# Patient Record
Sex: Male | Born: 1970 | Race: Black or African American | Hispanic: No | Marital: Married | Smoking: Never smoker
Health system: Southern US, Community
[De-identification: ages and names within clinical notes are randomized; demographics above are authoritative.]

## PROBLEM LIST (undated history)

## (undated) DIAGNOSIS — C8330 Diffuse large B-cell lymphoma, unspecified site: Secondary | ICD-10-CM

## (undated) DIAGNOSIS — D804 Selective deficiency of immunoglobulin M [IgM]: Secondary | ICD-10-CM

## (undated) DIAGNOSIS — G8929 Other chronic pain: Secondary | ICD-10-CM

## (undated) DIAGNOSIS — R9431 Abnormal electrocardiogram [ECG] [EKG]: Secondary | ICD-10-CM

## (undated) DIAGNOSIS — M51369 Other intervertebral disc degeneration, lumbar region without mention of lumbar back pain or lower extremity pain: Secondary | ICD-10-CM

## (undated) DIAGNOSIS — I10 Essential (primary) hypertension: Secondary | ICD-10-CM

## (undated) DIAGNOSIS — M5441 Lumbago with sciatica, right side: Secondary | ICD-10-CM

## (undated) DIAGNOSIS — M5136 Other intervertebral disc degeneration, lumbar region: Secondary | ICD-10-CM

## (undated) DIAGNOSIS — E119 Type 2 diabetes mellitus without complications: Secondary | ICD-10-CM

## (undated) DIAGNOSIS — E785 Hyperlipidemia, unspecified: Secondary | ICD-10-CM

## (undated) DIAGNOSIS — G4733 Obstructive sleep apnea (adult) (pediatric): Secondary | ICD-10-CM

## (undated) HISTORY — DX: Selective deficiency of immunoglobulin m (igm): D80.4

## (undated) HISTORY — DX: Abnormal electrocardiogram (ECG) (EKG): R94.31

## (undated) HISTORY — DX: Other intervertebral disc degeneration, lumbar region: M51.36

## (undated) HISTORY — DX: Lumbago with sciatica, right side: M54.41

## (undated) HISTORY — DX: Hyperlipidemia, unspecified: E78.5

## (undated) HISTORY — DX: Other intervertebral disc degeneration, lumbar region without mention of lumbar back pain or lower extremity pain: M51.369

## (undated) HISTORY — DX: Essential (primary) hypertension: I10

## (undated) HISTORY — DX: Other chronic pain: G89.29

## (undated) HISTORY — DX: Diffuse large B-cell lymphoma, unspecified site: C83.30

## (undated) HISTORY — DX: Type 2 diabetes mellitus without complications: E11.9

## (undated) HISTORY — DX: Obstructive sleep apnea (adult) (pediatric): G47.33

---

## 2003-11-30 ENCOUNTER — Ambulatory Visit (HOSPITAL_BASED_OUTPATIENT_CLINIC_OR_DEPARTMENT_OTHER): Admission: RE | Admit: 2003-11-30 | Discharge: 2003-11-30 | Payer: Self-pay | Admitting: Otolaryngology

## 2010-09-16 ENCOUNTER — Ambulatory Visit
Admission: RE | Admit: 2010-09-16 | Discharge: 2010-09-16 | Disposition: A | Discharge status: Home / Self Care | Payer: No Typology Code available for payment source | Source: Ambulatory Visit | Attending: Family Medicine | Admitting: Family Medicine

## 2010-09-16 ENCOUNTER — Other Ambulatory Visit: Payer: Self-pay | Admitting: Family Medicine

## 2010-09-16 DIAGNOSIS — M25519 Pain in unspecified shoulder: Secondary | ICD-10-CM

## 2010-09-16 DIAGNOSIS — Z5689 Other problems related to employment: Secondary | ICD-10-CM

## 2013-02-23 ENCOUNTER — Ambulatory Visit (INDEPENDENT_AMBULATORY_CARE_PROVIDER_SITE_OTHER): Payer: Self-pay

## 2013-02-23 ENCOUNTER — Other Ambulatory Visit: Payer: Self-pay | Admitting: Family Medicine

## 2013-02-23 DIAGNOSIS — R52 Pain, unspecified: Secondary | ICD-10-CM

## 2013-02-23 DIAGNOSIS — M549 Dorsalgia, unspecified: Secondary | ICD-10-CM

## 2013-02-23 DIAGNOSIS — R079 Chest pain, unspecified: Secondary | ICD-10-CM

## 2013-11-04 ENCOUNTER — Other Ambulatory Visit: Payer: Self-pay | Admitting: Family Medicine

## 2013-11-04 ENCOUNTER — Ambulatory Visit (INDEPENDENT_AMBULATORY_CARE_PROVIDER_SITE_OTHER): Payer: Self-pay

## 2013-11-04 DIAGNOSIS — M79609 Pain in unspecified limb: Secondary | ICD-10-CM
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
## 2017-06-23 ENCOUNTER — Encounter: Payer: Self-pay | Admitting: Neurology

## 2017-06-23 ENCOUNTER — Ambulatory Visit: Payer: PRIVATE HEALTH INSURANCE | Admitting: Neurology

## 2017-06-23 VITALS — BP 131/89 | HR 66 | Ht 70.0 in | Wt 312.0 lb

## 2017-06-23 DIAGNOSIS — Z8572 Personal history of non-Hodgkin lymphomas: Secondary | ICD-10-CM | POA: Diagnosis not present

## 2017-06-23 DIAGNOSIS — R400 Somnolence: Secondary | ICD-10-CM

## 2017-06-23 DIAGNOSIS — G4733 Obstructive sleep apnea (adult) (pediatric): Secondary | ICD-10-CM

## 2017-06-23 DIAGNOSIS — R002 Palpitations: Secondary | ICD-10-CM | POA: Diagnosis not present

## 2017-06-23 DIAGNOSIS — R0789 Other chest pain: Secondary | ICD-10-CM | POA: Diagnosis not present

## 2017-06-23 DIAGNOSIS — G2581 Restless legs syndrome: Secondary | ICD-10-CM

## 2017-06-23 DIAGNOSIS — Z6841 Body Mass Index (BMI) 40.0 and over, adult: Secondary | ICD-10-CM | POA: Diagnosis not present

## 2017-06-23 DIAGNOSIS — R351 Nocturia: Secondary | ICD-10-CM

## 2017-06-23 NOTE — Progress Notes (Signed)
Subjective:    Patient ID: Billy Melendez is a 46 y.o. male.  HPI     Star Age, MD, PhD [facility] [address], [address] [place], [place] [address]  Dear Lorre Nick,   I saw your patient, Billy Melendez, upon your kind request, in my neurologic clinic today for initial consultation of his sleep disorder, in particular, concern for underlying obstructive sleep apnea. The patient is unaccompanied today. As you know, Billy Melendez is a 46 year old right-handed gentleman with an underlying medical history of hypertension, non-Hodgkin's lymphoma, hyperlipidemia, type 2 diabetes, chronic low back pain and morbid obesity with a BMI of over 40, who reports snoring and excessive daytime somnolence. He had a sleep study several years ago under ENT. Prior sleep study results are not available for my review today, but he was told he had OSA. I reviewed your office note from 04/22/2017, which you kindly included. His Epworth sleepiness score is 22 out of 24 today, fatigue score of 63 out of 63. He is married and lives with his wife. They have 4 children. He is a nonsmoker and does not utilize alcohol, does not drink caffeine on a daily basis. He had lost weight in the past, down to 260 lb, but the back pain came back and he was not able to exercise. He had lymph node removal in 12/16, s/p chemo and radiation in 1/17. BT varies and is after his wife's. 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 He has an appointment with pain management pending. He had a recent episode of chest pain and history of palpitations. He has seen cardiology for this. He was supposed to have an echocardiogram but has not had it yet. He endorses restless leg symptoms at night. He has leg movements during sleep. He typically does not have morning headaches. He has multiple bathroom breaks at night. He does not typically keep a scheduled for his bedtime and wake time. His snoring is loud and bothersome to his  wife.  His Past Medical History Is Significant For: Past Medical History:  Diagnosis Date  . Abnormal EKG   . Chronic bilateral low back pain with right-sided sciatica   . Degenerative disc disease, lumbar   . Diabetes mellitus without complication ([place])   . DLBCL (diffuse large B cell lymphoma) ([place])   . Hyperlipidemia LDL goal <100   . Hypertension   . 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 OSA (obstructive sleep apnea)     His Past Surgical History Is Significant For:  His Family History Is Significant For: No family history on file.  His Social History Is Significant For: Social History   Socioeconomic History  . Marital status: Married    Spouse name: None  . Number of children: None  . Years of education: None  . Highest education level: None  Social Needs  . Financial resource strain: None  . Food insecurity - worry: None  . Food insecurity - inability: None  . Transportation needs - medical: None  . Transportation needs - non-medical: None  Occupational History  . None  Tobacco Use  . Smoking status: Never Smoker  . Smokeless tobacco: Never Used  Substance and Sexual Activity  . Alcohol use: No    Frequency: Never  . Drug use: No  . Sexual activity: None  Other Topics Concern  . None  Social History Narrative  . None    His Allergies Are:  Allergies  Allergen Reactions  .  Peanut-Containing Drug Products   . Pineapple Itching  . Tramadol   :   His Current Medications Are:  Outpatient Encounter Medications as of 06/23/2017  Medication Sig  . atenolol (TENORMIN) 100 MG tablet Take 100 mg by mouth daily.  Marland Kitchen atorvastatin (LIPITOR) 20 MG tablet Take 20 mg by mouth daily.  . Ginger, Zingiber officinalis, (GINGER ROOT PO) Take by mouth.  . metFORMIN (GLUCOPHAGE) 500 MG tablet Take by mouth 2 (two) times daily with a meal.  . nabumetone (RELAFEN) 750 MG tablet Take 750 mg by mouth daily.  . sildenafil (REVATIO) 20 MG tablet Take 20 mg by mouth.   No  facility-administered encounter medications on file as of 06/23/2017.   :  Review of Systems:  Out of a complete 14 point review of systems, all are reviewed and negative with the exception of these symptoms as listed below: Review of Systems  Neurological:       Pt presents today to discuss his sleep. Pt has had a sleep study in the past but was not treated for osa. Pt does endorse snoring.  Epworth Sleepiness Scale 0= would never doze 1= slight chance of dozing 2= moderate chance of dozing 3= high chance of dozing  Sitting and reading: 3 Watching TV: 3 Sitting inactive in a public place (ex. Theater or meeting): 3 As a passenger in a car for an hour without a break: 3 Lying down to rest in the afternoon: 3 Sitting and talking to someone: 2 Sitting quietly after lunch (no alcohol): 3 In a car, while stopped in traffic: 2 Total: 22    Objective:  Neurological Exam  Physical Exam Physical Examination:   Vitals:   06/23/17 1535  BP: 131/89  Pulse: 66    General Examination: The patient is a very pleasant 46 y.o. male in no acute distress. He appears well-developed and well-nourished and well groomed.   HEENT: Normocephalic, atraumatic, pupils are equal, round and reactive to light and accommodation. Funduscopic exam is normal with sharp disc margins noted. Extraocular tracking is good without limitation to gaze excursion or nystagmus noted. Normal smooth pursuit is noted. Hearing is grossly intact. Tympanic membranes are clear bilaterally. Face is symmetric with normal facial animation and normal facial sensation. Speech is clear with no dysarthria noted. There is no hypophonia. There is no lip, neck/head, jaw or voice tremor. Neck is supple with full range of passive and active motion. There are no carotid bruits on auscultation. Oropharynx exam reveals: mild mouth dryness, adequate dental hygiene and marked airway crowding, due to large tongue, large uvula, thicker soft palate,  tonsils and place but not fully visualized. Mallampati is class III. Tongue protrudes centrally and palate elevates symmetrically. Neck size is 20.25 inches. Nasal inspection reveals mild nasal mucosal bogginess, mild redness and no septal deviation, inf turb hypertrophy b/l.   Chest: Clear to auscultation without wheezing, rhonchi or crackles noted.  Heart: S1+S2+0, regular and normal without murmurs, rubs or gallops noted.   Abdomen: Soft, non-tender and non-distended with normal bowel sounds appreciated on auscultation.  Extremities: There is no pitting edema in the distal lower extremities bilaterally. Pedal pulses are intact.  Skin: Warm and dry without trophic changes noted.  Musculoskeletal: exam reveals no obvious joint deformities, tenderness or joint swelling or erythema.   Neurologically:  Mental status: The patient is awake, alert and oriented in all 4 spheres. His immediate and remote memory, attention, language skills and fund of knowledge are appropriate. There is no  evidence of aphasia, agnosia, apraxia or anomia. Speech is clear with normal prosody and enunciation. Thought process is linear. Mood is normal and affect is normal.  Cranial nerves II - XII are as described above under HEENT exam. In addition: shoulder shrug is normal with equal shoulder height noted. Motor exam: Normal bulk, strength and tone is noted. There is no drift, tremor or rebound. Romberg is negative. Reflexes are 1+ throughout. Fine motor skills and coordination: intact with normal finger taps, normal hand movements, normal rapid alternating patting, normal foot taps and normal foot agility.  Cerebellar testing: No dysmetria or intention tremor on finger to nose testing. Heel to shin is unremarkable bilaterally. There is no truncal or gait ataxia.  Sensory exam: intact to light touch in the upper and lower extremities.  Gait, station and balance: He stands easily. No veering to one side is noted. No leaning  to one side is noted. Posture is age-appropriate and stance is narrow based. Gait shows normal stride length and normal pace. No problems turning are noted. Tandem walk is doable, but challenging in the beginning.    Assessment and plan:  In summary, Billy Melendez is a very pleasant 46 y.o.-year old male with a history and physical exam concerning for obstructive sleep apnea (OSA). I had a long chat with the patient about my findings and the diagnosis of OSA, its prognosis and treatment options. We talked about medical treatments, surgical interventions and non-pharmacological approaches. I explained in particular the risks and ramifications of untreated moderate to severe OSA, especially with respect to developing cardiovascular disease down the Road, including congestive heart failure, difficult to treat hypertension, cardiac arrhythmias, or stroke. Even type 2 diabetes has, in part, been linked to untreated OSA. Symptoms of untreated OSA include daytime sleepiness, memory problems, mood irritability and mood disorder such as depression and anxiety, lack of energy, as well as recurrent headaches, especially morning headaches. We talked about trying to maintain a healthy lifestyle in general, as well as the importance of weight control. I encouraged the patient to eat healthy, exercise daily and keep well hydrated, to keep a scheduled bedtime and wake time routine, to not skip any meals and eat healthy snacks in between meals. I advised the patient not to drive when feeling sleepy. I recommended the following at this time: sleep study with potential positive airway pressure titration. (We will score hypopneas at 3%).   I explained the sleep test procedure to the patient and also outlined possible surgical and non-surgical treatment options of OSA, including the use of a custom-made dental device (which would require a referral to a specialist dentist or oral surgeon), upper airway surgical options, such as  pillar implants, radiofrequency surgery, tongue base surgery, and UPPP (which would involve a referral to an ENT surgeon). Rarely, jaw surgery such as mandibular advancement may be considered.  I also explained the CPAP treatment option to the patient, who indicated that he would be willing to try CPAP if the need arises. I explained the importance of being compliant with PAP treatment, not only for insurance purposes but primarily to improve His symptoms, and for the patient's long term health benefit, including to reduce His cardiovascular risks. I answered all his questions today and the patient was in agreement. I would like to see him back after the sleep study is completed and encouraged him to call with any interim questions, concerns, problems or updates.   Thank you very much for allowing me to participate  in the care of this nice patient. If I can be of any further assistance to you please do not hesitate to call me at [phone].  Sincerely,   Star Age, MD, PhD

## 2017-06-23 NOTE — Patient Instructions (Signed)
Thank you for choosing [facility] for your sleep related care! It was nice to meet you today! I appreciate that you entrust me with your sleep related healthcare concerns. I hope, I was able to address at least some of your concerns today, and that I can help you feel reassured and also get better.    Here is what we discussed today and what we came up with as our plan for you:    Based on your symptoms and your exam I believe you are still at risk for obstructive sleep apnea or OSA, and I think we should proceed with a sleep study to determine whether you do or do not have OSA and how severe it is. If you have more than mild OSA, I want you to consider treatment with CPAP. Please remember, the risks and ramifications of moderate to severe obstructive sleep apnea or OSA are: Cardiovascular disease, including congestive heart failure, stroke, difficult to control hypertension, arrhythmias, and even type 2 diabetes has been linked to untreated OSA. Sleep apnea causes disruption of sleep and sleep deprivation in most cases, which, in turn, can cause recurrent headaches, problems with memory, mood, concentration, focus, and vigilance. Most people with untreated sleep apnea report excessive daytime sleepiness, which can affect their ability to drive. Please do not drive if you feel sleepy.   I will likely see you back after your sleep study to go over the test results and where to go from there. We will call you after your sleep study to advise about the results (most likely, you will hear from [place], my nurse) and to set up an appointment at the time, as necessary.    Our sleep lab administrative assistant, Arrie Aran will call you to schedule your sleep study. If you don't hear back from her by about 2 weeks from now, please feel free to call her at [phone]. You can leave a message with your phone number and concerns, if you get the voicemail box. She will call back as soon as possible.

## 2017-08-23 ENCOUNTER — Ambulatory Visit (INDEPENDENT_AMBULATORY_CARE_PROVIDER_SITE_OTHER): Payer: PRIVATE HEALTH INSURANCE | Admitting: Neurology

## 2017-08-23 DIAGNOSIS — G4733 Obstructive sleep apnea (adult) (pediatric): Secondary | ICD-10-CM

## 2017-08-23 DIAGNOSIS — G2581 Restless legs syndrome: Secondary | ICD-10-CM

## 2017-08-23 DIAGNOSIS — R351 Nocturia: Secondary | ICD-10-CM

## 2017-08-23 DIAGNOSIS — R400 Somnolence: Secondary | ICD-10-CM

## 2017-08-23 DIAGNOSIS — R002 Palpitations: Secondary | ICD-10-CM

## 2017-08-23 DIAGNOSIS — Z8572 Personal history of non-Hodgkin lymphomas: Secondary | ICD-10-CM

## 2017-08-23 DIAGNOSIS — Z6841 Body Mass Index (BMI) 40.0 and over, adult: Secondary | ICD-10-CM

## 2017-08-23 DIAGNOSIS — R0789 Other chest pain: Secondary | ICD-10-CM

## 2017-08-26 ENCOUNTER — Other Ambulatory Visit: Payer: Self-pay | Admitting: Neurology

## 2017-08-26 ENCOUNTER — Telehealth: Payer: Self-pay

## 2017-08-26 DIAGNOSIS — G4733 Obstructive sleep apnea (adult) (pediatric): Secondary | ICD-10-CM

## 2017-08-26 NOTE — Progress Notes (Signed)
Patient referred by Billy Mare, PA, seen by me on 06/23/17, split night sleep study on 08/23/17. Please call and notify patient that the recent sleep study confirmed the diagnosis of severe OSA. He did well with CPAP during the study with significant improvement of the respiratory events. Therefore, I would like start the patient on CPAP therapy at home by prescribing a machine for home use. I placed the order in the chart.  Please advise patient that we need a follow up appointment with either myself or one of our nurse practitioners in about 10 weeks post set-up to check for how the patient is feeling and how well the patient is using the machine, etc. Please go ahead and schedule the appointment, while you have the patient on the phone and make sure patient understands the importance of keeping this window for the FU appointment, as it is often an insurance requirement. Failing to adhere to this may result in losing coverage for sleep apnea treatment, at which point most patients are left with a choice of returning the machine or paying out of pocket (and we want neither of this to happen!).  Please re-enforce the importance of compliance with treatment and the need for [place] to monitor compliance data - again an insurance requirement and usually a good feedback for the patient as far as how they are doing.  Also remind patient, that any PAP machine or mask issues should be first addressed with the DME company, who provided the machine/mask.  Please ask if patient has a preference regarding DME company, may depend on the insurance too.  Please arrange for CPAP set up at home through a DME company of patient's choice.  Once you have spoken to the patient you can close the phone encounter. Please fax/route report to referring provider, thanks,   Star Age, MD, PhD [facility])

## 2017-08-26 NOTE — Telephone Encounter (Signed)
-----   Message from Star Age, MD sent at 08/26/2017  8:01 AM EST ----- Patient referred by Fredric Mare, PA, seen by me on 06/23/17, split night sleep study on 08/23/17. Please call and notify patient that the recent sleep study confirmed the diagnosis of severe OSA. He did well with CPAP during the study with significant improvement of the respiratory events. Therefore, I would like start the patient on CPAP therapy at home by prescribing a machine for home use. I placed the order in the chart.  Please advise patient that we need a follow up appointment with either myself or one of our nurse practitioners in about 10 weeks post set-up to check for how the patient is feeling and how well the patient is using the machine, etc. Please go ahead and schedule the appointment, while you have the patient on the phone and make sure patient understands the importance of keeping this window for the FU appointment, as it is often an insurance requirement. Failing to adhere to this may result in losing coverage for sleep apnea treatment, at which point most patients are left with a choice of returning the machine or paying out of pocket (and we want neither of this to happen!).  Please re-enforce the importance of compliance with treatment and the need for [place] to monitor compliance data - again an insurance requirement and usually a good feedback for the patient as far as how they are doing.  Also remind patient, that any PAP machine or mask issues should be first addressed with the DME company, who provided the machine/mask.  Please ask if patient has a preference regarding DME company, may depend on the insurance too.  Please arrange for CPAP set up at home through a DME company of patient's choice.  Once you have spoken to the patient you can close the phone encounter. Please fax/route report to referring provider, thanks,   Star Age, MD, PhD [facility])

## 2017-08-26 NOTE — Telephone Encounter (Signed)
I called pt. I advised pt that Dr. Rexene Alberts reviewed their sleep study results and found that pt severe OSA but did well with CPAP during the study. Dr. Rexene Alberts recommends that pt start CPAP at home. I reviewed PAP compliance expectations with the pt. Pt is agreeable to starting a CPAP. I advised pt that an order will be sent to a DME, [facility], and [facility] will call the pt within about one week after they file with the pt's insurance. [facility] will show the pt how to use the machine, fit for masks, and troubleshoot the CPAP if needed. A follow up appt was made for insurance purposes with Dr. Rexene Alberts on November 03, 2017. Pt verbalized understanding to arrive 15 minutes early and bring their CPAP. A letter with all of this information in it will be mailed to the pt as a reminder. I verified with the pt that the address we have on file is correct. Pt verbalized understanding of results. Pt had no questions at this time but was encouraged to call back if questions arise.

## 2017-08-26 NOTE — Procedures (Signed)
PATIENT'S NAME:  Billy Melendez, Billy Melendez DOB:      06-12-1971      MR#:    [id number]     DATE OF RECORDING: 08/23/2017 REFERRING M.D.:  Fredric Mare, PA-C Study Performed:  Split-Night Titration Study HISTORY: 47 year old man with a history of hypertension, non-Hodgkin's lymphoma, hyperlipidemia, type 2 diabetes, chronic low back pain and morbid obesity with a BMI of over 40, who reports snoring and excessive daytime somnolence. He carries a prior diagnosis of OSA. His Epworth sleepiness score is 22 out of 24 today.  The patient's weight 312 pounds with a height of 70 (inches), resulting in a BMI of 44.8 kg/m2. The patient's neck circumference measured 20.2 inches.   CURRENT MEDICATIONS: Atenolol, Atorvastatin, Metformin, Nabumetone and Silfenafil.  PROCEDURE:  This is a multichannel digital polysomnogram utilizing the Somnostar 11.2 system.  Electrodes and sensors were applied and monitored per AASM Specifications.   EEG, EOG, Chin and Limb EMG, were sampled at 200 Hz.  ECG, Snore and Nasal Pressure, Thermal Airflow, Respiratory Effort, CPAP Flow and Pressure, Oximetry was sampled at 50 Hz. Digital video and audio were recorded.      BASELINE STUDY WITHOUT CPAP RESULTS:  Lights Out was at 22:33 and Lights On at 05:28 for the night, split start at 01:22, epoch 345.  Total recording time (TRT) was 169, with a total sleep time (TST) of 144 minutes.   The patient's sleep latency was 11.5 minutes.  REM latency was 133 minutes.  The sleep efficiency was 85.2 %.    SLEEP ARCHITECTURE: WASO (Wake after sleep onset) was 5 minutes, Stage N1 was 5 minutes, Stage N2 was 125 minutes, Stage N3 was 0 minutes and Stage R (REM sleep) was 14 minutes. The percentages were Stage N1 3.5%, Stage N2 86.8%, Stage N3 was absent and Stage R (REM sleep) 9.7%. The arousals were noted as: 4 were spontaneous, 0 were associated with PLMs, 172 were associated with respiratory events.  Audio and video analysis did not show any abnormal or  unusual movements, behaviors, phonations or vocalizations. The patient took one bathroom break for the night. Moderate to loud snoring was noted. The EKG was in keeping with normal sinus rhythm (NSR)  RESPIRATORY ANALYSIS:  There were a total of 173 respiratory events:  70 obstructive apneas, 0 central apneas and 0 mixed apneas with a total of 70 apneas and an apnea index (AI) of 29.2. There were 103 hypopneas with a hypopnea index of 42.9. The patient also had 0 respiratory event related arousals (RERAs).  Snoring was noted.     The total APNEA/HYPOPNEA INDEX (AHI) was 72.1 /hour and the total RESPIRATORY DISTURBANCE INDEX was 72.1 /hour.  14 events occurred in REM sleep and 198 events in NREM. The REM AHI was 60, /hour versus a non-REM AHI of 73.4 /hour. The patient spent 175 minutes sleep time in the supine position 204 minutes in non-supine. The supine AHI was 71.9 /hour versus a non-supine AHI of 75.0 /hour.  OXYGEN SATURATION & C02:  The wake baseline 02 saturation was 92%, with the lowest being 68%. Time spent below 89% saturation equaled 89 minutes.  PERIODIC LIMB MOVEMENTS: The patient had a total of 0 Periodic Limb Movements.  The Periodic Limb Movement (PLM) index was 0 /hour and the PLM Arousal index was 0 /hour.  TITRATION STUDY WITH CPAP RESULTS:   The patient was fitted with a MW Nasal Dreamwear mask. CPAP was initiated at 5 cmH20 with heated humidity per AASM  split night standards and pressure was advanced to 9 cmH20 because of hypopneas, apneas and desaturations.  At a PAP pressure of 9 cmH20, there was a reduction of the AHI to 0/hour with non-supine REM sleep achieved and O2 nadir of 90%.   Total recording time (TRT) was 246.5 minutes, with a total sleep time (TST) of 235 minutes. The patient's sleep latency was 5 minutes. REM latency was 43.5 minutes.  The sleep efficiency was 95.3 %.    SLEEP ARCHITECTURE: Wake after sleep was 6.5 minutes, Stage N1 6.5 minutes, Stage N2 140  minutes, Stage N3 0 minutes and Stage R (REM sleep) 88.5 minutes. The percentages were: Stage N1 2.8%, Stage N2 59.6%, Stage N3 was absent and Stage R (REM sleep) 37.7%, which is increased, and in keeping with rebound. The arousals were noted as: 7 were spontaneous, 0 were associated with PLMs, 7 were associated with respiratory events.  RESPIRATORY ANALYSIS:  There were a total of 7 respiratory events: 0 obstructive apneas, 0 central apneas and 0 mixed apneas with a total of 0 apneas and an apnea index (AI) of 0. There were 7 hypopneas with a hypopnea index of 1.8 /hour. The patient also had 0 respiratory event related arousals (RERAs).      The total APNEA/HYPOPNEA INDEX (AHI) was 1.8 /hour and the total RESPIRATORY DISTURBANCE INDEX was 1.8 /hour.  3 events occurred in REM sleep and 4 events in NREM. The REM AHI was 2. /hour versus a non-REM AHI of 1.6 /hour. REM sleep was achieved on a pressure of  cm/h2o (AHI was  .) The patient spent 17% of total sleep time in the supine position. The supine AHI was 0.0 /hour, versus a non-supine AHI of 2.1/hour.  OXYGEN SATURATION & C02:  The wake baseline 02 saturation was 94%, with the lowest being 71%. Time spent below 89% saturation equaled 9 minutes.  PERIODIC LIMB MOVEMENTS: The patient had a total of 0 Periodic Limb Movements. The Periodic Limb Movement (PLM) index was 0 /hour and the PLM Arousal index was 0 /hour.  Post-study, the patient indicated that sleep was better than usual.  POLYSOMNOGRAPHY IMPRESSION :   1. Obstructive Sleep Apnea (OSA)   RECOMMENDATIONS: 1. This patient has severe obstructive sleep apnea and responded well on CPAP therapy. I will, therefore, start the patient on home CPAP treatment at a pressure of 9 cm via MW nasal mask with heated humidity. The patient should be reminded to be fully compliant with PAP therapy to improve sleep related symptoms and decrease long term cardiovascular risks. Please note that untreated  obstructive sleep apnea carries additional perioperative morbidity. Patients with significant obstructive sleep apnea should receive perioperative PAP therapy and the surgeons and particularly the anesthesiologist should be informed of the diagnosis and the severity of the sleep disordered breathing. 2. The patient should be cautioned not to drive, work at heights, or operate dangerous or heavy equipment when tired or sleepy. Review and reiteration of good sleep hygiene measures should be pursued with any patient. 3. The patient will be seen in follow-up by Dr. Rexene Alberts at [facility] for discussion of the test results and further management strategies. The referring provider will be notified of the test results.  I certify that I have reviewed the entire raw data recording prior to the issuance of this report in accordance with the Standards of Accreditation of the American Academy of Sleep Medicine (AASM)   Star Age, MD, PhD Diplomat, American Board of Psychiatry and Neurology (  Neurology and Sleep Medicine)

## 2017-11-03 ENCOUNTER — Ambulatory Visit: Payer: Self-pay | Admitting: Neurology

## 2017-11-24 ENCOUNTER — Encounter: Payer: Self-pay | Admitting: Neurology

## 2017-11-24 NOTE — Telephone Encounter (Signed)
Received this notice from [place]: "He had a Cpap that he wanted [place] to just do a Transfer of care to set his Cpap and get him new supplies.   So he is using a Cpap but we do not have access to AirView or Encore for him, he will likely need to bring in an [place] for download.  Please let me know if there is anything you need from me.'  I called pt, he is agreeable to bringing his cpap to the visit tomorrow with Dr. Rexene Alberts for a download.

## 2017-11-25 ENCOUNTER — Encounter: Payer: Self-pay | Admitting: Neurology

## 2017-11-25 ENCOUNTER — Ambulatory Visit (INDEPENDENT_AMBULATORY_CARE_PROVIDER_SITE_OTHER): Payer: PRIVATE HEALTH INSURANCE | Admitting: Neurology

## 2017-11-25 VITALS — BP 134/86 | HR 67 | Ht 70.0 in | Wt 305.0 lb

## 2017-11-25 DIAGNOSIS — G4733 Obstructive sleep apnea (adult) (pediatric): Secondary | ICD-10-CM

## 2017-11-25 DIAGNOSIS — Z9989 Dependence on other enabling machines and devices: Secondary | ICD-10-CM

## 2017-11-25 NOTE — Progress Notes (Signed)
Subjective:    Patient ID: Billy Melendez is a 47 y.o. male.  HPI     Interim history:   Mr. Billy Melendez is a 47 year old right-handed gentleman with an underlying medical history of hypertension, non-Hodgkin's lymphoma, hyperlipidemia, type 2 diabetes, chronic low back pain and morbid obesity with a BMI of over 40, who presents for follow-up consultation of his obstructive sleep apnea, after sleep study testing and starting CPAP therapy. Patient is unaccompanied today. I first met him on 06/23/2018 at the request of his primary care provider, at which time he reported snoring and daytime somnolence as well as a prior diagnosis of sleep apnea. I suggested we proceed with a sleep study. He had a split-night sleep study on 08/23/2017. I went over his test results with him in detail today. Baseline sleep latency was 11.5 minutes, REM latency was 133 minutes, sleep efficiency at baseline at 85.2%. He had absence of slow-wave sleep and an increased percentage of stage II sleep, total AHI was 72.1 per hour, average oxygen saturation 92%, nadir was 68%. He was fitted with a nasal mask and CPAP was initiated at 5 cm and titrated to 9 cm. On the final pressure his AHI was 0 per hour, nonsupine REM sleep was achieved and O2 nadir was 90%. He had no significant PLMS during the study. On CPAP therapy he had absence of slow-wave sleep with a significant amount of REM rebound. On his test results I prescribed CPAP therapy for home use.  Today, 11/25/2017: I reviewed his CPAP compliance data from his data chip from 10/26/2017 through 11/24/2017 which is a total of 30 days, during which time he used his CPAP every night with percent used days greater than 4 hours at 90%, indicating excellent compliance with an average usage of 7 hours and 7 minutes, residual AHI at 3.6 per hour. Information on air leak is not available on this report. He reports feeling well.he has lost a little bit of weight I first met him, in the realm of 7  pounds. He is able to sleep in the same with his wife, nocturia is much better, feels good in AM, sleep is more consolidated and restful. Has been able to use a machine, that his cousin had - he passed away and had a lot of new supplies. [employer] was kind enough to set his pressure for him.   The patient's allergies, current medications, family history, past medical history, past social history, past surgical history and problem list were reviewed and updated as appropriate.   Previously (copied from previous notes for reference):   06/23/2017: (He) reports snoring and excessive daytime somnolence. He had a sleep study several years ago under ENT. Prior sleep study results are not available for my review today, but he was told he had OSA. I reviewed your office note from 04/22/2017, which you kindly included. His Epworth sleepiness score is 22 out of 24 today, fatigue score of 63 out of 63. He is married and lives with his wife. They have 4 children. He is a nonsmoker and does not utilize alcohol, does not drink caffeine on a daily basis. He had lost weight in the past, down to 260 lb, but the back pain came back and he was not able to exercise. He had lymph node removal in 12/16, s/p chemo and radiation in 1/17. BT varies and is after his wife's. 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 He has an appointment with pain management pending.  He had a recent episode of chest pain and history of palpitations. He has seen cardiology for this. He was supposed to have an echocardiogram but has not had it yet. He endorses restless leg symptoms at night. He has leg movements during sleep. He typically does not have morning headaches. He has multiple bathroom breaks at night. He does not typically keep a scheduled for his bedtime and wake time. His snoring is loud and bothersome to his wife.  His Past Medical History Is Significant For: Past Medical History:  Diagnosis Date  . Abnormal EKG   . Chronic  bilateral low back pain with right-sided sciatica   . Degenerative disc disease, lumbar   . Diabetes mellitus without complication ([place])   . DLBCL (diffuse large B cell lymphoma) ([place])   . Hyperlipidemia LDL goal <100   . Hypertension   . 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 OSA (obstructive sleep apnea)    His Past Surgical History Is Significant For:  His Family History Is Significant For: No family history on file.  Her Social History Is Significant For: Social History   Socioeconomic History  . Marital status: Married    Spouse name: Not on file  . Number of children: Not on file  . Years of education: Not on file  . Highest education level: Not on file  Occupational History  . Not on file  Social Needs  . Financial resource strain: Not on file  . Food insecurity:    Worry: Not on file    Inability: Not on file  . Transportation needs:    Medical: Not on file    Non-medical: Not on file  Tobacco Use  . Smoking status: Never Smoker  . Smokeless tobacco: Never Used  Substance and Sexual Activity  . Alcohol use: No    Frequency: Never  . Drug use: No  . Sexual activity: Not on file  Lifestyle  . Physical activity:    Days per week: Not on file    Minutes per session: Not on file  . Stress: Not on file  Relationships  . Social connections:    Talks on phone: Not on file    Gets together: Not on file    Attends religious service: Not on file    Active member of club or organization: Not on file    Attends meetings of clubs or organizations: Not on file    Relationship status: Not on file  Other Topics Concern  . Not on file  Social History Narrative  . Not on file    His Allergies Are:  Allergies  Allergen Reactions  . Peanut-Containing Drug Products   . Pineapple Itching  . Tramadol   :   His Current Medications Are:  Outpatient Encounter Medications as of 11/25/2017  Medication Sig  . APPLE CIDER VINEGAR PO Take by mouth.  Marland Kitchen atenolol (TENORMIN) 100 MG tablet  Take 100 mg by mouth daily.  Marland Kitchen atorvastatin (LIPITOR) 20 MG tablet Take 20 mg by mouth daily.  . metFORMIN (GLUCOPHAGE) 500 MG tablet Take by mouth 2 (two) times daily with a meal.  . nabumetone (RELAFEN) 750 MG tablet Take 750 mg by mouth daily.  . sildenafil (REVATIO) 20 MG tablet Take 20 mg by mouth.  . [DISCONTINUED] Ginger, Zingiber officinalis, (GINGER ROOT PO) Take by mouth.   No facility-administered encounter medications on file as of 11/25/2017.   :  Review of Systems:  Out of a complete 14 point review of  systems, all are reviewed and negative with the exception of these symptoms as listed below: Review of Systems  Neurological:       Pt presents today to discuss his cpap. Pt is sleeping much better with cpap therapy.    Objective:  Neurological Exam  Physical Exam Physical Examination:   Vitals:   11/25/17 1301  BP: 134/86  Pulse: 67    General Examination: The patient is a very pleasant 47 y.o. male in no acute distress. He appears well-developed and well-nourished and well groomed.   HEENT: Normocephalic, atraumatic, pupils are equal, round and reactive to light and accommodation. Extraocular tracking is good without limitation to gaze excursion or nystagmus noted. Normal smooth pursuit is noted. Hearing is grossly intact. Face is symmetric with normal facial animation and normal facial sensation. Speech is clear with no dysarthria noted. There is no hypophonia. There is no lip, neck/head, jaw or voice tremor. Neck is supple with full range of passive and active motion. Oropharynx exam reveals: mild mouth dryness, adequate dental hygiene and marked airway crowding. Tongue protrudes centrally and palate elevates symmetrically.   Chest: Clear to auscultation without wheezing, rhonchi or crackles noted.  Heart: S1+S2+0, regular and normal without murmurs, rubs or gallops noted.   Abdomen: Soft, non-tender and non-distended with normal bowel sounds appreciated on  auscultation.  Extremities: There is no pitting edema in the distal lower extremities bilaterally. Pedal pulses are intact.  Skin: Warm and dry without trophic changes noted.  Musculoskeletal: exam reveals no obvious joint deformities, tenderness or joint swelling or erythema.   Neurologically:  Mental status: The patient is awake, alert and oriented in all 4 spheres. His immediate and remote memory, attention, language skills and fund of knowledge are appropriate. There is no evidence of aphasia, agnosia, apraxia or anomia. Speech is clear with normal prosody and enunciation. Thought process is linear. Mood is normal and affect is normal.  Cranial nerves II - XII are as described above under HEENT exam. In addition: shoulder shrug is normal with equal shoulder height noted. Motor exam: Normal bulk, strength and tone is noted. There is no tremor. Fine motor skills and coordination: grossly intact.  Cerebellar testing: No dysmetria or intention tremor. There is no truncal or gait ataxia.  Sensory exam: intact to light touch in the upper and lower extremities.  Gait, station and balance: He stands easily. No veering to one side is noted. No leaning to one side is noted. Posture is age-appropriate and stance is narrow based. Gait shows normal stride length and normal pace. No problems turning are noted.   Assessment and plan:  In summary, ALANDIS BLUEMEL is a very pleasant 47 year old male with an underlying medical history of hypertension, non-Hodgkin's lymphoma, hyperlipidemia, type 2 diabetes, chronic low back pain and morbid obesity with a BMI of over 81, who presents for follow-up consultation of his severe obstructive sleep apnea as determined by his split-night sleep study from 08/23/2017. He has since then establish treatment on CPAP at a pressure of 9 cm. He is compliant with treatment and indicates great results. He is commended for his treatment adherence and encouraged to continue to work  on weight loss. He is advised to be fully compliant with treatment. He indicates that he also uses his CPAP when he plans to take a nap. He is very pleased with his outcome so far, no issues with sinus infections, has plenty of supplies, no difficulty using CPAP. He is advised to routinely follow-up  in 6 months, he can see one of our nurse practitioners next time. We reviewed his sleep study results in detail together as well as his compliance data. I answered all his questions today and he was in agreement. I spent 25 minutes in total face-to-face time with the patient, more than 50% of which was spent in counseling and coordination of care, reviewing test results, reviewing medication and discussing or reviewing the diagnosis of OSA, its prognosis and treatment options. Pertinent laboratory and imaging test results that were available during this visit with the patient were reviewed by me and considered in my medical decision making (see chart for details).

## 2017-11-25 NOTE — Patient Instructions (Addendum)
Please continue using your CPAP regularly. While your insurance requires that you use CPAP at least 4 hours each night on 70% of the nights, I recommend, that you not skip any nights and use it throughout the night if you can. Getting used to CPAP and staying with the treatment long term does take time and patience and discipline. Untreated obstructive sleep apnea when it is moderate to severe can have an adverse impact on cardiovascular health and raise her risk for heart disease, arrhythmias, hypertension, congestive heart failure, stroke and diabetes. Untreated obstructive sleep apnea causes sleep disruption, nonrestorative sleep, and sleep deprivation. This can have an impact on your day to day functioning and cause daytime sleepiness and impairment of cognitive function, memory loss, mood disturbance, and problems focussing. Using CPAP regularly can improve these symptoms.  Keep up the good work! You are very compliant and I am glad you are feeling better.   We will see you back in 6 months for sleep apnea check up, you can see one of our nurse practitioners.

## 2018-06-01 ENCOUNTER — Encounter: Payer: Self-pay | Admitting: Adult Health

## 2018-06-02 ENCOUNTER — Ambulatory Visit: Payer: Medicare HMO | Admitting: Adult Health

## 2018-06-02 ENCOUNTER — Ambulatory Visit: Payer: PRIVATE HEALTH INSURANCE | Admitting: Adult Health

## 2018-06-02 ENCOUNTER — Encounter: Payer: Self-pay | Admitting: Adult Health

## 2018-06-02 VITALS — BP 139/87 | HR 75 | Ht 70.0 in | Wt 308.5 lb

## 2018-06-02 DIAGNOSIS — G4733 Obstructive sleep apnea (adult) (pediatric): Secondary | ICD-10-CM | POA: Diagnosis not present

## 2018-06-02 DIAGNOSIS — Z9989 Dependence on other enabling machines and devices: Secondary | ICD-10-CM

## 2018-06-02 NOTE — Patient Instructions (Signed)

## 2018-06-02 NOTE — Progress Notes (Addendum)
PATIENT: Billy Melendez DOB: 1970-09-14  REASON FOR VISIT: follow up HISTORY FROM: patient  HISTORY OF PRESENT ILLNESS: Today 06/02/18:  Billy Melendez is a 47 year old male with a history of obstructive sleep apnea on CPAP he returns today for follow-up.  His CPAP download indicates that he uses machine 30 out of 30 days for compliance of 100%.  He uses machine greater than 4 hours 27 days for compliance of 90%.  On average he uses his machine 6 hours and 36 minutes.  His residual AHI is 2.1 on 9 cmH2O with EPR of 3.  He reports that he currently has allergies and he is fighting congestion.  He continues to use the nasal pillows.  He states that he has been able to use the nasal pillows despite the congestion.  His Epworth sleepiness score is 10.  He continues to see the benefit with using a CPAP.  He would like a travel machine if insurance will cover.  HISTORY (copied from Billy Melendez note) 11/25/2017: I reviewed his CPAP compliance data from his data chip from 10/26/2017 through 11/24/2017 which is a total of 30 days, during which time he used his CPAP every night with percent used days greater than 4 hours at 90%, indicating excellent compliance with an average usage of 7 hours and 7 minutes, residual AHI at 3.6 per hour. Information on air leak is not available on this report. He reports feeling well.he has lost a little bit of weight I first met him, in the realm of 7 pounds. He is able to sleep in the same with his wife, nocturia is much better, feels good in AM, sleep is more consolidated and restful. Has been able to use a machine, that his cousin had - he passed away and had a lot of new supplies. [facility] was kind enough to set his pressure for him.   REVIEW OF SYSTEMS: Out of a complete 14 system review of symptoms, the patient complains only of the following symptoms, and all other reviewed systems are negative.  See HPI  ALLERGIES: Allergies  Allergen Reactions  . Peanut-Containing  Drug Products   . Pineapple Itching  . Tramadol     HOME MEDICATIONS: Outpatient Medications Prior to Visit  Medication Sig Dispense Refill  . APPLE CIDER VINEGAR PO Take by mouth.    Marland Kitchen atenolol (TENORMIN) 100 MG tablet Take 100 mg by mouth daily.    Marland Kitchen atorvastatin (LIPITOR) 20 MG tablet Take 20 mg by mouth daily.    Marland Kitchen gabapentin (NEURONTIN) 300 MG capsule Take 600 mg by mouth 3 (three) times daily.    . metFORMIN (GLUCOPHAGE) 500 MG tablet Take by mouth 2 (two) times daily with a meal.    . nabumetone (RELAFEN) 750 MG tablet Take 750 mg by mouth daily.    . sildenafil (REVATIO) 20 MG tablet Take 20 mg by mouth.     No facility-administered medications prior to visit.     PAST MEDICAL HISTORY: Past Medical History:  Diagnosis Date  . Abnormal EKG   . Chronic bilateral low back pain with right-sided sciatica   . Degenerative disc disease, lumbar   . Diabetes mellitus without complication ([place])   . DLBCL (diffuse large B cell lymphoma) ([place])   . Hyperlipidemia LDL goal <100   . Hypertension   . 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 OSA (obstructive sleep apnea)     PAST SURGICAL HISTORY:  FAMILY HISTORY: No family history on file.  SOCIAL  HISTORY: Social History   Socioeconomic History  . Marital status: Married    Spouse name: Not on file  . Number of children: Not on file  . Years of education: Not on file  . Highest education level: Not on file  Occupational History  . Not on file  Social Needs  . Financial resource strain: Not on file  . Food insecurity:    Worry: Not on file    Inability: Not on file  . Transportation needs:    Medical: Not on file    Non-medical: Not on file  Tobacco Use  . Smoking status: Never Smoker  . Smokeless tobacco: Never Used  Substance and Sexual Activity  . Alcohol use: No    Frequency: Never  . Drug use: No  . Sexual activity: Not on file  Lifestyle  . Physical activity:    Days per week: Not on file    Minutes per session: Not  on file  . Stress: Not on file  Relationships  . Social connections:    Talks on phone: Not on file    Gets together: Not on file    Attends religious service: Not on file    Active member of club or organization: Not on file    Attends meetings of clubs or organizations: Not on file    Relationship status: Not on file  . Intimate partner violence:    Fear of current or ex partner: Not on file    Emotionally abused: Not on file    Physically abused: Not on file    Forced sexual activity: Not on file  Other Topics Concern  . Not on file  Social History Narrative  . Not on file      PHYSICAL EXAM  Vitals:   06/02/18 0809  BP: 139/87  Pulse: 75  Weight: (!) 308 lb 8 oz (139.9 kg)  Height: _0  (1.778 m)   Body mass index is 44.27 kg/m.  Generalized: Well developed, in no acute distress   Neurological examination  Mentation: Alert oriented to time, place, history taking. Follows all commands speech and language fluent Cranial nerve II-XII:  Extraocular movements were full, visual field were full on confrontational test. Facial sensation and strength were normal. Uvula tongue midline. Head turning and shoulder shrug  were normal and symmetric.  Neck circumference 19 and half inches, Mallampati 4+ Motor: The motor testing reveals 5 over 5 strength of all 4 extremities. Good symmetric motor tone is noted throughout.  Sensory: Sensory testing is intact to soft touch on all 4 extremities. No evidence of extinction is noted.  Coordination: Cerebellar testing reveals good finger-nose-finger and heel-to-shin bilaterally.  Gait and station: Gait is normal.  Reflexes: Deep tendon reflexes are symmetric and normal bilaterally.   DIAGNOSTIC DATA (LABS, IMAGING, TESTING) - I reviewed patient records, labs, notes, testing and imaging myself where available.     ASSESSMENT AND PLAN 47 y.o. 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 here with:  1.  Obstructive sleep apnea on CPAP  The patient CPAP download shows excellent compliance and good treatment of his apnea.  He is encouraged to continue using CPAP nightly and greater than 4 hours each night.  He is advised that if  his symptoms worsen or he develops new symptoms he should let us know.  He will follow-up in 1 year or sooner if needed.   I spent 15 minutes with the patient. 50% of this time was spent reviewing CPAP download   Ward Givens, MSN, NP-C 06/02/2018, 8:15 AM [facility] [address], [place], [place] [address] [phone]  I reviewed the above note and documentation by the Nurse Practitioner and agree with the history, physical exam, assessment and plan as outlined above. I was immediately available for face-to-face consultation. Star Age, MD, PhD [facility])

## 2018-06-02 NOTE — Progress Notes (Signed)
Community message sent to [facility] re: new CPAP order- travel CPAP.

## 2018-06-09 ENCOUNTER — Telehealth: Payer: Self-pay | Admitting: Adult Health

## 2018-06-09 DIAGNOSIS — Z9989 Dependence on other enabling machines and devices: Principal | ICD-10-CM

## 2018-06-09 DIAGNOSIS — G4733 Obstructive sleep apnea (adult) (pediatric): Secondary | ICD-10-CM

## 2018-06-09 NOTE — Telephone Encounter (Signed)
Spoke with patient and advised him on NP's message. He stated he only mentioned a travel CPAP but wasn't going to get one. He stated his machine was given to him, and it is about 47 years old. He didn't have insurance at that time; but he now has insurance with [employer]. He would now like a new machine since he has insurance. This RN advised will send his request to NP who is out of the office at this time.  Advised he'll get a call back tomorrow.  Patient verbalized understanding, appreciation.

## 2018-06-09 NOTE — Telephone Encounter (Signed)
He asked for a travel machine. He is not eligible for a new machine.

## 2018-06-09 NOTE — Telephone Encounter (Signed)
Pt requesting a call stating the order fr the CPAP was wrong. Stating he is wanting a new CPAP not a travel CPAP. Please advise

## 2018-06-10 NOTE — Telephone Encounter (Signed)
Order for new machine placed.  We will see if insurance covers it.

## 2018-06-10 NOTE — Telephone Encounter (Signed)
Spoke with patient and informed him that new CPAP machine order has been placed. Advised he call in a few days if he hasn't heard from [facility]. This RN sent community message to [facility] advising them of new CPAP order and also faxed patient 's snapshot, insurance information, sleep study result and order to [facility].

## 2018-09-14 ENCOUNTER — Encounter: Payer: Self-pay | Admitting: Family Medicine

## 2018-09-16 NOTE — Progress Notes (Addendum)
PATIENT: Billy Melendez DOB: 1970-08-11  REASON FOR VISIT: follow up HISTORY FROM: patient  Chief Complaint  Patient presents with  . Sleep Apnea     HISTORY OF PRESENT ILLNESS: Today 09/17/18 [name] is a 48 y.o. male here today for follow up for obstructive sleep apnea on CPAP. His CPAP download indicates that he uses machine 30 out of 30 days for compliance of 100%. He uses machine greater than 4 hours 25/30 days for compliance of 83%.  On average he uses his machine 5 hours and 45 minutes.  His residual AHI is 1.8 on 9 cmH2O with EPR of 3. He continues to use the nasal pillows with no difficulty.  He continues to see the benefit with using a CPAP.    He is sleeping much better with less waking events at night.  He was unable to get a travel machine due to cost.   HISTORY: (copied from [place] note on 06/09/2018) [name] is a 48 year old male with a history of obstructive sleep apnea on CPAP he returns today for follow-up.  His CPAP download indicates that he uses machine 30 out of 30 days for compliance of 100%.  He uses machine greater than 4 hours 27 days for compliance of 90%.  On average he uses his machine 6 hours and 36 minutes.  His residual AHI is 2.1 on 9 cmH2O with EPR of 3.  He reports that he currently has allergies and he is fighting congestion.  He continues to use the nasal pillows.  He states that he has been able to use the nasal pillows despite the congestion.  His Epworth sleepiness score is 10.  He continues to see the benefit with using a CPAP.  He would like a travel machine if insurance will cover.  REVIEW OF SYSTEMS: Out of a complete 14 system review of symptoms, the patient complains only of the following symptoms, snoring, pain, leg pain, and all other reviewed systems are negative.  ALLERGIES: Allergies  Allergen Reactions  . Peanut-Containing Drug Products   . Pineapple Itching  . Tramadol     HOME MEDICATIONS: Outpatient Medications  Prior to Visit  Medication Sig Dispense Refill  . atenolol (TENORMIN) 100 MG tablet Take 100 mg by mouth daily.    Marland Kitchen atorvastatin (LIPITOR) 20 MG tablet Take 20 mg by mouth daily.    . metFORMIN (GLUCOPHAGE) 500 MG tablet Take by mouth 2 (two) times daily with a meal.    . sildenafil (REVATIO) 20 MG tablet Take 20 mg by mouth.    . APPLE CIDER VINEGAR PO Take by mouth.    . gabapentin (NEURONTIN) 300 MG capsule Take 600 mg by mouth 3 (three) times daily.    . nabumetone (RELAFEN) 750 MG tablet Take 750 mg by mouth daily.     No facility-administered medications prior to visit.     PAST MEDICAL HISTORY: Past Medical History:  Diagnosis Date  . Abnormal EKG   . Chronic bilateral low back pain with right-sided sciatica   . Degenerative disc disease, lumbar   . Diabetes mellitus without complication ([place])   . DLBCL (diffuse large B cell lymphoma) ([place])   . Hyperlipidemia LDL goal <100   . Hypertension   . 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 OSA (obstructive sleep apnea)     PAST SURGICAL HISTORY: History reviewed. No pertinent surgical history.  FAMILY HISTORY: History reviewed. No pertinent family history.  SOCIAL HISTORY: Social History  Socioeconomic History  . Marital status: Married    Spouse name: Not on file  . Number of children: Not on file  . Years of education: Not on file  . Highest education level: Not on file  Occupational History  . Not on file  Social Needs  . Financial resource strain: Not on file  . Food insecurity:    Worry: Not on file    Inability: Not on file  . Transportation needs:    Medical: Not on file    Non-medical: Not on file  Tobacco Use  . Smoking status: Never Smoker  . Smokeless tobacco: Never Used  Substance and Sexual Activity  . Alcohol use: No    Frequency: Never  . Drug use: No  . Sexual activity: Not on file  Lifestyle  . Physical activity:    Days per week: Not on file    Minutes per session: Not on file  . Stress: Not on  file  Relationships  . Social connections:    Talks on phone: Not on file    Gets together: Not on file    Attends religious service: Not on file    Active member of club or organization: Not on file    Attends meetings of clubs or organizations: Not on file    Relationship status: Not on file  . Intimate partner violence:    Fear of current or ex partner: Not on file    Emotionally abused: Not on file    Physically abused: Not on file    Forced sexual activity: Not on file  Other Topics Concern  . Not on file  Social History Narrative  . Not on file      PHYSICAL EXAM  Vitals:   09/17/18 0846  BP: 132/88  Pulse: 70  Resp: 14  Weight: (!) 306 lb 6.4 oz (139 kg)  Height: 5\' 10"  (1.778 m)   Body mass index is 43.96 kg/m.  Generalized: Well developed, in no acute distress  Cardiology: normal rate and rhythm, no murmur noted Neurological examination  Mentation: Alert oriented to time, place, history taking. Follows all commands speech and language fluent Cranial nerve II-XII: Pupils were equal round reactive to light. Extraocular movements were full, visual field were full on confrontational test. Facial sensation and strength were normal. Uvula tongue midline. Head turning and shoulder shrug  were normal and symmetric. Motor: The motor testing reveals 5 over 5 strength of all 4 extremities. Good symmetric motor tone is noted throughout.  Sensory: Sensory testing is intact to soft touch on all 4 extremities. No evidence of extinction is noted.  Coordination: Cerebellar testing reveals good finger-nose-finger bilaterally.  Gait and station: Gait is normal.   DIAGNOSTIC DATA (LABS, IMAGING, TESTING) - I reviewed patient records, labs, notes, testing and imaging myself where available.  No flowsheet data found.   No results found for: WBC, HGB, HCT, MCV, PLT No results found for: NA, K, CL, CO2, GLUCOSE, BUN, CREATININE, CALCIUM, PROT, ALBUMIN, AST, ALT, ALKPHOS, BILITOT,  GFRNONAA, GFRAA No results found for: CHOL, HDL, LDLCALC, LDLDIRECT, TRIG, CHOLHDL No results found for: HGBA1C No results found for: VITAMINB12 No results found for: TSH     ASSESSMENT AND PLAN 48 y.o. 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 here with     ICD-10-CM   1. OSA on CPAP G47.33    Z99.89     Download shows optimal compliance.  He is doing very well with nightly use of CPAP.  He does verbalize significant benefit from using machine.  I have encouraged him to use CPAP greater than 4 hours each night.  We have discussed potential risk of untreated sleep apnea.  Patient reports he has all supplies needed at this time.  He will follow-up in 1 year.   No orders of the defined types were placed in this encounter.    No orders of the defined types were placed in this encounter.     I spent 15 minutes with the patient. 50% of this time was spent counseling and educating patient on plan of care and medications.    Debbora Presto, FNP-C 09/17/2018, 9:50 AM [facility] [address] [place], [place] [address] [phone]  I reviewed the above note and documentation by the Nurse Practitioner and agree with the history, physical exam, assessment and plan as outlined above. Star Age, MD, PhD [facility])

## 2018-09-16 NOTE — Telephone Encounter (Signed)
Received this notice from [facility]: "Good evening!   Patient has met compliance, for insurance, but is in need of a second face to face for billing. I don't see any new notes since 05/2018. Please schedule patient for a follow up. "  I called pt. He is agreeable to seeing Amy, NP tomorrow for a cpap follow up. He will bring his cpap and arrive at 8:30am for check in. Pt verbalized understanding of appt date and time.

## 2018-09-17 ENCOUNTER — Encounter: Payer: Self-pay | Admitting: Family Medicine

## 2018-09-17 ENCOUNTER — Other Ambulatory Visit: Payer: Self-pay

## 2018-09-17 ENCOUNTER — Ambulatory Visit: Payer: Medicare HMO | Admitting: Family Medicine

## 2018-09-17 VITALS — BP 132/88 | HR 70 | Resp 14 | Ht 70.0 in | Wt 306.4 lb

## 2018-09-17 DIAGNOSIS — G4733 Obstructive sleep apnea (adult) (pediatric): Secondary | ICD-10-CM

## 2018-09-17 DIAGNOSIS — Z9989 Dependence on other enabling machines and devices: Secondary | ICD-10-CM | POA: Diagnosis not present

## 2018-09-17 NOTE — Patient Instructions (Signed)
Continue CPAP nightly and for greater than 4 hours each night  CPAP and BPAP Information CPAP and BPAP are methods of helping a person breathe with the use of air pressure. CPAP stands for "continuous positive airway pressure." BPAP stands for "bi-level positive airway pressure." In both methods, air is blown through your nose or mouth and into your air passages to help you breathe well. CPAP and BPAP use different amounts of pressure to blow air. With CPAP, the amount of pressure stays the same while you breathe in and out. With BPAP, the amount of pressure is increased when you breathe in (inhale) so that you can take larger breaths. Your health care provider will recommend whether CPAP or BPAP would be more helpful for you. Why are CPAP and BPAP treatments used? CPAP or BPAP can be helpful if you have:  Sleep apnea.  Chronic obstructive pulmonary disease (COPD).  Heart failure.  Medical conditions that weaken the muscles of the chest including muscular dystrophy, or neurological diseases such as amyotrophic lateral sclerosis (ALS).  Other problems that cause breathing to be weak, abnormal, or difficult. CPAP is most commonly used for obstructive sleep apnea (OSA) to keep the airways from collapsing when the muscles relax during sleep. How is CPAP or BPAP administered? Both CPAP and BPAP are provided by a small machine with a flexible plastic tube that attaches to a plastic mask. You wear the mask. Air is blown through the mask into your nose or mouth. The amount of pressure that is used to blow the air can be adjusted on the machine. Your health care provider will determine the pressure setting that should be used based on your individual needs. When should CPAP or BPAP be used? In most cases, the mask only needs to be worn during sleep. Generally, the mask needs to be worn throughout the night and during any daytime naps. People with certain medical conditions may also need to wear the mask  at other times when they are awake. Follow instructions from your health care provider about when to use the machine. What are some tips for using the mask?   Because the mask needs to be snug, some people feel trapped or closed-in (claustrophobic) when first using the mask. If you feel this way, you may need to get used to the mask. One way to do this is by holding the mask loosely over your nose or mouth and then gradually applying the mask more snugly. You can also gradually increase the amount of time that you use the mask.  Masks are available in various types and sizes. Some fit over your mouth and nose while others fit over just your nose. If your mask does not fit well, talk with your health care provider about getting a different one.  If you are using a mask that fits over your nose and you tend to breathe through your mouth, a chin strap may be applied to help keep your mouth closed.  The CPAP and BPAP machines have alarms that may sound if the mask comes off or develops a leak.  If you have trouble with the mask, it is very important that you talk with your health care provider about finding a way to make the mask easier to tolerate. Do not stop using the mask. Stopping the use of the mask could have a negative impact on your health. What are some tips for using the machine?  Place your CPAP or BPAP machine on a secure  table or stand near an electrical outlet.  Know where the on/off switch is located on the machine.  Follow instructions from your health care provider about how to set the pressure on your machine and when you should use it.  Do not eat or drink while the CPAP or BPAP machine is on. Food or fluids could get pushed into your lungs by the pressure of the CPAP or BPAP.  Do not smoke. Tobacco smoke residue can damage the machine.  For home use, CPAP and BPAP machines can be rented or purchased through home health care companies. Many different brands of machines are  available. Renting a machine before purchasing may help you find out which particular machine works well for you.  Keep the CPAP or BPAP machine and attachments clean. Ask your health care provider for specific instructions. Get help right away if:  You have redness or open areas around your nose or mouth where the mask fits.  You have trouble using the CPAP or BPAP machine.  You cannot tolerate wearing the CPAP or BPAP mask.  You have pain, discomfort, and bloating in your abdomen. Summary  CPAP and BPAP are methods of helping a person breathe with the use of air pressure.  Both CPAP and BPAP are provided by a small machine with a flexible plastic tube that attaches to a plastic mask.  If you have trouble with the mask, it is very important that you talk with your health care provider about finding a way to make the mask easier to tolerate. This information is not intended to replace advice given to you by your health care provider. Make sure you discuss any questions you have with your health care provider. Document Released: 04/04/2004 Document Revised: 03/09/2018 Document Reviewed: 05/26/2016 Elsevier Interactive Patient Education  2019 [employer].

## 2019-06-08 ENCOUNTER — Ambulatory Visit: Payer: Medicare HMO | Admitting: Adult Health

## 2019-09-22 ENCOUNTER — Ambulatory Visit: Payer: Medicare HMO | Admitting: Family Medicine

## 2019-09-26 ENCOUNTER — Encounter: Payer: Self-pay | Admitting: Family Medicine

## 2019-09-26 ENCOUNTER — Ambulatory Visit: Payer: Medicare HMO | Admitting: Family Medicine

## 2019-09-26 ENCOUNTER — Other Ambulatory Visit: Payer: Self-pay

## 2019-09-26 VITALS — BP 135/82 | HR 75 | Temp 98.0°F | Ht 70.0 in | Wt 300.0 lb

## 2019-09-26 DIAGNOSIS — Z9989 Dependence on other enabling machines and devices: Secondary | ICD-10-CM

## 2019-09-26 DIAGNOSIS — G4733 Obstructive sleep apnea (adult) (pediatric): Secondary | ICD-10-CM

## 2019-09-26 NOTE — Patient Instructions (Signed)
Continue CPAP nighty and for greater than 4 hours each night   Follow up in 1 year   Sleep Apnea Sleep apnea affects breathing during sleep. It causes breathing to stop for a short time or to become shallow. It can also increase the risk of:  Heart attack.  Stroke.  Being very overweight (obese).  Diabetes.  Heart failure.  Irregular heartbeat. The goal of treatment is to help you breathe normally again. What are the causes? There are three kinds of sleep apnea:  Obstructive sleep apnea. This is caused by a blocked or collapsed airway.  Central sleep apnea. This happens when the brain does not send the right signals to the muscles that control breathing.  Mixed sleep apnea. This is a combination of obstructive and central sleep apnea. The most common cause of this condition is a collapsed or blocked airway. This can happen if:  Your throat muscles are too relaxed.  Your tongue and tonsils are too large.  You are overweight.  Your airway is too small. What increases the risk?  Being overweight.  Smoking.  Having a small airway.  Being older.  Being male.  Drinking alcohol.  Taking medicines to calm yourself (sedatives or tranquilizers).  Having family members with the condition. What are the signs or symptoms?  Trouble staying asleep.  Being sleepy or tired during the day.  Getting angry a lot.  Loud snoring.  Headaches in the morning.  Not being able to focus your mind (concentrate).  Forgetting things.  Less interest in sex.  Mood swings.  Personality changes.  Feelings of sadness (depression).  Waking up a lot during the night to pee (urinate).  Dry mouth.  Sore throat. How is this diagnosed?  Your medical history.  A physical exam.  A test that is done when you are sleeping (sleep study). The test is most often done in a sleep lab but may also be done at home. How is this treated?   Sleeping on your side.  Using a  medicine to get rid of mucus in your nose (decongestant).  Avoiding the use of alcohol, medicines to help you relax, or certain pain medicines (narcotics).  Losing weight, if needed.  Changing your diet.  Not smoking.  Using a machine to open your airway while you sleep, such as: ? An oral appliance. This is a mouthpiece that shifts your lower jaw forward. ? A CPAP device. This device blows air through a mask when you breathe out (exhale). ? An EPAP device. This has valves that you put in each nostril. ? A BPAP device. This device blows air through a mask when you breathe in (inhale) and breathe out.  Having surgery if other treatments do not work. It is important to get treatment for sleep apnea. Without treatment, it can lead to:  High blood pressure.  Coronary artery disease.  In men, not being able to have an erection (impotence).  Reduced thinking ability. Follow these instructions at home: Lifestyle  Make changes that your doctor recommends.  Eat a healthy diet.  Lose weight if needed.  Avoid alcohol, medicines to help you relax, and some pain medicines.  Do not use any products that contain nicotine or tobacco, such as cigarettes, e-cigarettes, and chewing tobacco. If you need help quitting, ask your doctor. General instructions  Take over-the-counter and prescription medicines only as told by your doctor.  If you were given a machine to use while you sleep, use it only as told  by your doctor.  If you are having surgery, make sure to tell your doctor you have sleep apnea. You may need to bring your device with you.  Keep all follow-up visits as told by your doctor. This is important. Contact a doctor if:  The machine that you were given to use during sleep bothers you or does not seem to be working.  You do not get better.  You get worse. Get help right away if:  Your chest hurts.  You have trouble breathing in enough air.  You have an uncomfortable  feeling in your back, arms, or stomach.  You have trouble talking.  One side of your body feels weak.  A part of your face is hanging down. These symptoms may be an emergency. Do not wait to see if the symptoms will go away. Get medical help right away. Call your local emergency services (911 in the [place] Do not drive yourself to the hospital. Summary  This condition affects breathing during sleep.  The most common cause is a collapsed or blocked airway.  The goal of treatment is to help you breathe normally while you sleep. This information is not intended to replace advice given to you by your health care provider. Make sure you discuss any questions you have with your health care provider. Document Revised: 04/23/2018 Document Reviewed: 03/02/2018 Elsevier Patient Education  [place].

## 2019-09-26 NOTE — Progress Notes (Addendum)
PATIENT: Billy Melendez DOB: 09/07/1970  REASON FOR VISIT: follow up HISTORY FROM: patient  Chief Complaint  Patient presents with  . Follow-up    New room. alone. yrly f/u. states that he is doing well however it is difficult to sleep on his side.     HISTORY OF PRESENT ILLNESS: Today 09/26/19 Billy Melendez is a 49 y.o. male here today for follow up for OSA on CPAP. He is doing well and continues to use therapy every night. He is using a nasal pillow and his only complaint is that the air is kinked when he sleeps on his side. Otherwise, he feels that CPAP significantly helps with improved sleep quality and fatigue.   Compliance report dated 08/23/2019 through 09/21/2019 reveals that he used CPAP 30 of the last 30 days for compliance of 100%.  He used CPAP greater than 4 hours all 30 days for compliance of 100%.  Average usage was 7 hours and 10 minutes.  Residual AHI was 1.9 on 9 cm of water and an EPR of 3.  There was no significant leak noted.   HISTORY: (copied from my note on 09/17/2018)  Billy Melendez is a 49 y.o. male here today for follow up for obstructive sleep apnea on CPAP. His CPAP download indicates that he uses machine 30 out of 30 days for compliance of 100%. He uses machine greater than 4 hours 25/30 days for compliance of 83%. On average he uses his machine 5 hours and 45 minutes. His residual AHI is 1.8 on 9 cmH2O with EPR of 3. He continues to use the nasal pillows with no difficulty.He continues to see the benefit with using a CPAP.   He is sleeping much better with less waking events at night.  He was unable to get a travel machine due to cost.   HISTORY: (copied from [place] note on 06/09/2018) Billy Melendez is a 49 year old male with a history of obstructive sleep apnea on CPAP he returns today for follow-up. His CPAP download indicates that he uses machine 30 out of 30 days for compliance of 100%. He uses machine greater than 4 hours 27 days for compliance  of 90%. On average he uses his machine 6 hours and 36 minutes. His residual AHI is 2.1 on 9 cmH2O with EPR of 3. He reports that he currently has allergies andhe is fighting congestion. He continues to use the nasal pillows. He states that he has been able to use the nasal pillows despite the congestion. His Epworth sleepiness score is 10.He continues to see the benefit with using a CPAP. He would like a travel machine if insurance will cover.   REVIEW OF SYSTEMS: Out of a complete 14 system review of symptoms, the patient complains only of the following symptoms, none and all other reviewed systems are negative.  ESS: 9  ALLERGIES: Allergies  Allergen Reactions  . Peanut-Containing Drug Products   . Pineapple Itching  . Tramadol     HOME MEDICATIONS: Outpatient Medications Prior to Visit  Medication Sig Dispense Refill  . atenolol (TENORMIN) 100 MG tablet Take 50 mg by mouth daily.     Marland Kitchen atorvastatin (LIPITOR) 20 MG tablet Take 20 mg by mouth daily.    . metFORMIN (GLUCOPHAGE) 500 MG tablet Take by mouth 2 (two) times daily with a meal.    . oxyCODONE-acetaminophen (PERCOCET) 7.5-325 MG tablet Proscribed by PCP    . sildenafil (REVATIO) 20 MG tablet Take 20 mg  by mouth.     No facility-administered medications prior to visit.    PAST MEDICAL HISTORY: Past Medical History:  Diagnosis Date  . Abnormal EKG   . Chronic bilateral low back pain with right-sided sciatica   . Degenerative disc disease, lumbar   . Diabetes mellitus without complication ([place])   . DLBCL (diffuse large B cell lymphoma) ([place])   . Hyperlipidemia LDL goal <100   . Hypertension   . 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 OSA (obstructive sleep apnea)     PAST SURGICAL HISTORY: History reviewed. No pertinent surgical history.  FAMILY HISTORY: History reviewed. No pertinent family history.  SOCIAL HISTORY: Social History   Socioeconomic History  . Marital status: Married    Spouse name: Not on file  .  Number of children: Not on file  . Years of education: Not on file  . Highest education level: Not on file  Occupational History  . Not on file  Tobacco Use  . Smoking status: Never Smoker  . Smokeless tobacco: Never Used  Substance and Sexual Activity  . Alcohol use: No  . Drug use: No  . Sexual activity: Not on file  Other Topics Concern  . Not on file  Social History Narrative  . Not on file   Social Determinants of Health   Financial Resource Strain:   . Difficulty of Paying Living Expenses: Not on file  Food Insecurity:   . Worried About Charity fundraiser in the Last Year: Not on file  . Ran Out of Food in the Last Year: Not on file  Transportation Needs:   . Lack of Transportation (Medical): Not on file  . Lack of Transportation (Non-Medical): Not on file  Physical Activity:   . Days of Exercise per Week: Not on file  . Minutes of Exercise per Session: Not on file  Stress:   . Feeling of Stress : Not on file  Social Connections:   . Frequency of Communication with Friends and Family: Not on file  . Frequency of Social Gatherings with Friends and Family: Not on file  . Attends Religious Services: Not on file  . Active Member of Clubs or Organizations: Not on file  . Attends Archivist Meetings: Not on file  . Marital Status: Not on file  Intimate Partner Violence:   . Fear of Current or Ex-Partner: Not on file  . Emotionally Abused: Not on file  . Physically Abused: Not on file  . Sexually Abused: Not on file      PHYSICAL EXAM  Vitals:   09/26/19 1251  BP: 135/82  Pulse: 75  Temp: 98 F (36.7 C)  Weight: 300 lb (136.1 kg)  Height: 5\' 10"  (1.778 m)   Body mass index is 43.05 kg/m.  Generalized: Well developed, in no acute distress  Cardiology: normal rate and rhythm, no murmur noted Respiratory: clear to auscultation bilaterally  Neurological examination  Mentation: Alert oriented to time, place, history taking. Follows all commands  speech and language fluent Cranial nerve II-XII: Pupils were equal round reactive to light. Extraocular movements were full, visual field were full  Motor: The motor testing reveals 5 over 5 strength of all 4 extremities. Good symmetric motor tone is noted throughout.  Gait and station: Gait is normal.   DIAGNOSTIC DATA (LABS, IMAGING, TESTING) - I reviewed patient records, labs, notes, testing and imaging myself where available.  No flowsheet data found.   No results found for: WBC, HGB, HCT,  MCV, PLT No results found for: NA, K, CL, CO2, GLUCOSE, BUN, CREATININE, CALCIUM, PROT, ALBUMIN, AST, ALT, ALKPHOS, BILITOT, GFRNONAA, GFRAA No results found for: CHOL, HDL, LDLCALC, LDLDIRECT, TRIG, CHOLHDL No results found for: HGBA1C No results found for: VITAMINB12 No results found for: TSH     ASSESSMENT AND PLAN 49 y.o. 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 here with     ICD-10-CM   1. OSA on CPAP  G47.33 For home use only DME continuous positive airway pressure (CPAP)   Z99.89     Mr Gillyard continues to do very well with CPAP therapy.  Compliance report reveals excellent compliance.  He was encouraged to continue using CPAP nightly and for greater than 4 hours each night.  We will send orders for DME supplies today.  We will follow-up in 1 year, sooner if needed.  He verbalizes understanding and agreement with this plan.   Orders Placed This Encounter  Procedures  . For home use only DME continuous positive airway pressure (CPAP)    Supplies please    Order Specific Question:   Length of Need    Answer:   Lifetime    Order Specific Question:   Patient has OSA or probable OSA     Answer:   Yes    Order Specific Question:   Is the patient currently using CPAP in the home    Answer:   Yes    Order Specific Question:   Settings    Answer:   Other see comments    Order Specific Question:   CPAP supplies needed    Answer:   Mask, headgear, cushions, filters, heated tubing and water chamber     No orders of the defined types were placed in this encounter.     I spent 15 minutes with the patient. 50% of this time was spent counseling and educating patient on plan of care and medications.    Debbora Presto, FNP-C 09/26/2019, 1:33 PM [facility] [address] [place], [place] [address] [phone]  I reviewed the above note and documentation by the Nurse Practitioner and agree with the history, exam, assessment and plan as outlined above. I was available for consultation. Star Age, MD, PhD [facility])

## 2019-09-30 ENCOUNTER — Ambulatory Visit: Payer: Medicare HMO | Attending: Internal Medicine

## 2019-09-30 DIAGNOSIS — Z23 Encounter for immunization: Secondary | ICD-10-CM

## 2019-09-30 NOTE — Progress Notes (Signed)
   Covid-19 Vaccination Clinic  Name:  Billy Melendez    MRN: [id number] DOB: Jun 23, 1971  09/30/2019  Mr. Valera was observed post Covid-19 immunization for 15 minutes without incident. He was provided with Vaccine Information Sheet and instruction to access the V-Safe system.   Mr. Usselman was instructed to call 911 with any severe reactions post vaccine: Marland Kitchen Difficulty breathing  . Swelling of face and throat  . A fast heartbeat  . A bad rash all over body  . Dizziness and weakness   Immunizations Administered    Name Date Dose VIS Date Route   Pfizer COVID-19 Vaccine 09/30/2019 11:31 AM 0.3 mL 07/01/2019 Intramuscular   Manufacturer: [place]   Lot: [id number]   [place]: [id number]

## 2019-10-24 ENCOUNTER — Ambulatory Visit: Payer: Medicare HMO | Attending: Internal Medicine

## 2019-10-24 DIAGNOSIS — Z23 Encounter for immunization: Secondary | ICD-10-CM

## 2019-10-24 NOTE — Progress Notes (Signed)
   Covid-19 Vaccination Clinic  Name:  Billy Melendez    MRN: [id number] DOB: 30-Jul-1970  10/24/2019  [name] was observed post Covid-19 immunization for 15 minutes without incident. He was provided with Vaccine Information Sheet and instruction to access the V-Safe system.   [name] was instructed to call 911 with any severe reactions post vaccine: Marland Kitchen Difficulty breathing  . Swelling of face and throat  . A fast heartbeat  . A bad rash all over body  . Dizziness and weakness   Immunizations Administered    Name Date Dose VIS Date Route   Pfizer COVID-19 Vaccine 10/24/2019  2:38 PM 0.3 mL 07/01/2019 Intramuscular   Manufacturer: [place]   Lot: [id number]   [place]: [id number]

## 2020-09-25 ENCOUNTER — Encounter: Payer: Self-pay | Admitting: Family Medicine

## 2020-09-25 ENCOUNTER — Ambulatory Visit: Payer: Medicare HMO | Admitting: Family Medicine

## 2020-09-25 VITALS — BP 129/84 | HR 81 | Ht 70.0 in | Wt 302.0 lb

## 2020-09-25 DIAGNOSIS — G4733 Obstructive sleep apnea (adult) (pediatric): Secondary | ICD-10-CM | POA: Diagnosis not present

## 2020-09-25 DIAGNOSIS — Z9989 Dependence on other enabling machines and devices: Secondary | ICD-10-CM | POA: Diagnosis not present

## 2020-09-25 NOTE — Progress Notes (Addendum)
PATIENT: Billy Melendez DOB: November 10, 1970  REASON FOR VISIT: follow up HISTORY FROM: patient  Chief Complaint  Patient presents with  . Follow-up    RM 2 Alone  Pt is well, CPAP works excellent he sleeps great., cant get a nap without it if he's out or staying somewhere else.     HISTORY OF PRESENT ILLNESS: 09/25/20 ALL:  He returns for follow up of OSA on CPAP. He continues to do very well on CPAP therapy. His only complaint is that he can not sleep without it. He has been traveling more and unable to use CPAP for naps on plane or if in the car for a longer trip. He would like to get a travel machine but it is not covered by insurance.   Compliance report dated 08/25/2020 through 09/23/2020 reveals that he used CPAP 29 of the past 30 days for compliance of 97%.  He is CPAP greater than 4 hours 28 of the past 30 days for compliance of 93%.  Average usage on days used was 8 hours and 21 minutes.  Residual AHI was 1.5 on a set pressure of 9 cm of water and EPR of 3.  There was no significant leak noted.    09/26/2019 ALL:  Billy Melendez is a 50 y.o. male here today for follow up for OSA on CPAP. He is doing well and continues to use therapy every night. He is using a nasal pillow and his only complaint is that the air is kinked when he sleeps on his side. Otherwise, he feels that CPAP significantly helps with improved sleep quality and fatigue.   Compliance report dated 08/23/2019 through 09/21/2019 reveals that he used CPAP 30 of the last 30 days for compliance of 100%.  He used CPAP greater than 4 hours all 30 days for compliance of 100%.  Average usage was 7 hours and 10 minutes.  Residual AHI was 1.9 on 9 cm of water and an EPR of 3.  There was no significant leak noted.   HISTORY: (copied from my note on 09/17/2018)  Billy Melendez is a 50 y.o. male here today for follow up for obstructive sleep apnea on CPAP. His CPAP download indicates that he uses machine 30 out of 30 days for compliance of  100%. He uses machine greater than 4 hours 25/30 days for compliance of 83%. On average he uses his machine 5 hours and 45 minutes. His residual AHI is 1.8 on 9 cmH2O with EPR of 3. He continues to use the nasal pillows with no difficulty.He continues to see the benefit with using a CPAP.   He is sleeping much better with less waking events at night.  He was unable to get a travel machine due to cost.   HISTORY: (copied from [place] note on 06/09/2018) Billy Melendez is a 50 year old male with a history of obstructive sleep apnea on CPAP he returns today for follow-up. His CPAP download indicates that he uses machine 30 out of 30 days for compliance of 100%. He uses machine greater than 4 hours 27 days for compliance of 90%. On average he uses his machine 6 hours and 36 minutes. His residual AHI is 2.1 on 9 cmH2O with EPR of 3. He reports that he currently has allergies andhe is fighting congestion. He continues to use the nasal pillows. He states that he has been able to use the nasal pillows despite the congestion. His Epworth sleepiness score is 10.He continues  to see the benefit with using a CPAP. He would like a travel machine if insurance will cover.   REVIEW OF SYSTEMS: Out of a complete 14 system review of symptoms, the patient complains only of the following symptoms, none and all other reviewed systems are negative.  ESS: 8  ALLERGIES: Allergies  Allergen Reactions  . Peanut-Containing Drug Products   . Pineapple Itching  . Tramadol     HOME MEDICATIONS: Outpatient Medications Prior to Visit  Medication Sig Dispense Refill  . atenolol (TENORMIN) 100 MG tablet Take 50 mg by mouth daily.     Marland Kitchen atorvastatin (LIPITOR) 20 MG tablet Take 20 mg by mouth daily.    . metFORMIN (GLUCOPHAGE) 500 MG tablet Take by mouth 2 (two) times daily with a meal.    . oxyCODONE-acetaminophen (PERCOCET) 7.5-325 MG tablet Proscribed by PCP    . sildenafil (REVATIO) 20 MG tablet Take  20 mg by mouth.     No facility-administered medications prior to visit.    PAST MEDICAL HISTORY: Past Medical History:  Diagnosis Date  . Abnormal EKG   . Chronic bilateral low back pain with right-sided sciatica   . Degenerative disc disease, lumbar   . Diabetes mellitus without complication ([place])   . DLBCL (diffuse large B cell lymphoma) ([place])   . Hyperlipidemia LDL goal <100   . Hypertension   . 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 OSA (obstructive sleep apnea)     PAST SURGICAL HISTORY: History reviewed. No pertinent surgical history.  FAMILY HISTORY: History reviewed. No pertinent family history.  SOCIAL HISTORY: Social History   Socioeconomic History  . Marital status: Married    Spouse name: Not on file  . Number of children: Not on file  . Years of education: Not on file  . Highest education level: Not on file  Occupational History  . Not on file  Tobacco Use  . Smoking status: Never Smoker  . Smokeless tobacco: Never Used  Substance and Sexual Activity  . Alcohol use: No  . Drug use: No  . Sexual activity: Not on file  Other Topics Concern  . Not on file  Social History Narrative  . Not on file   Social Determinants of Health   Financial Resource Strain: Not on file  Food Insecurity: Not on file  Transportation Needs: Not on file  Physical Activity: Not on file  Stress: Not on file  Social Connections: Not on file  Intimate Partner Violence: Not on file      PHYSICAL EXAM  Vitals:   09/25/20 1451  BP: 129/84  Pulse: 81  Weight: (!) 302 lb (137 kg)  Height: 5\' 10"  (1.778 m)   Body mass index is 43.33 kg/m.  Generalized: Well developed, in no acute distress  Cardiology: normal rate and rhythm, no murmur noted Respiratory: clear to auscultation bilaterally  Neurological examination  Mentation: Alert oriented to time, place, history taking. Follows all commands speech and language fluent Cranial nerve II-XII: Pupils were equal round reactive to  light. Extraocular movements were full, visual field were full  Motor: The motor testing reveals 5 over 5 strength of all 4 extremities. Good symmetric motor tone is noted throughout.  Gait and station: Gait is normal.   DIAGNOSTIC DATA (LABS, IMAGING, TESTING) - I reviewed patient records, labs, notes, testing and imaging myself where available.  No flowsheet data found.   No results found for: WBC, HGB, HCT, MCV, PLT No results found for: NA, K, CL, CO2,  GLUCOSE, BUN, CREATININE, CALCIUM, PROT, ALBUMIN, AST, ALT, ALKPHOS, BILITOT, GFRNONAA, GFRAA No results found for: CHOL, HDL, LDLCALC, LDLDIRECT, TRIG, CHOLHDL No results found for: HGBA1C No results found for: VITAMINB12 No results found for: TSH     ASSESSMENT AND PLAN 50 y.o. 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 here with     ICD-10-CM   1. OSA on CPAP  G47.33 For home use only DME continuous positive airway pressure (CPAP)   Z99.89      Mr Biela continues to do very well with CPAP therapy.  Compliance report reveals excellent compliance.  He was encouraged to continue using CPAP nightly and for greater than 4 hours each night.  We will send orders for DME supplies today. He will discuss options for travel machine with DME and may consider appealing with insurance. We will follow-up in 1 year, sooner if needed.  He verbalizes understanding and agreement with this plan.   Orders Placed This Encounter  Procedures  . For home use only DME continuous positive airway pressure (CPAP)    Supplies    Order Specific Question:   Length of Need    Answer:   Lifetime    Order Specific Question:   Patient has OSA or probable OSA     Answer:   Yes    Order Specific Question:   Is the patient currently using CPAP in the home    Answer:   Yes    Order Specific Question:   Settings    Answer:   Other see comments    Order Specific Question:   CPAP supplies needed    Answer:   Mask, headgear, cushions, filters, heated tubing and water chamber     No orders of the defined types were placed in this encounter.     I spent 15 minutes with the patient. 50% of this time was spent counseling and educating patient on plan of care and medications.    Debbora Presto, FNP-C 09/25/2020, 3:52 PM [facility] [address] [place], [place] [address] [phone]  I reviewed the above note and documentation by the Nurse Practitioner and agree with the history, exam, assessment and plan as outlined above. I was available for consultation. Star Age, MD, PhD [facility])

## 2020-09-25 NOTE — Patient Instructions (Signed)
Please continue using your CPAP regularly. While your insurance requires that you use CPAP at least 4 hours each night on 70% of the nights, I recommend, that you not skip any nights and use it throughout the night if you can. Getting used to CPAP and staying with the treatment long term does take time and patience and discipline. Untreated obstructive sleep apnea when it is moderate to severe can have an adverse impact on cardiovascular health and raise her risk for heart disease, arrhythmias, hypertension, congestive heart failure, stroke and diabetes. Untreated obstructive sleep apnea causes sleep disruption, nonrestorative sleep, and sleep deprivation. This can have an impact on your day to day functioning and cause daytime sleepiness and impairment of cognitive function, memory loss, mood disturbance, and problems focussing. Using CPAP regularly can improve these symptoms.   Follow up in 1 year   Sleep Apnea Sleep apnea affects breathing during sleep. It causes breathing to stop for a short time or to become shallow. It can also increase the risk of:  Heart attack.  Stroke.  Being very overweight (obese).  Diabetes.  Heart failure.  Irregular heartbeat. The goal of treatment is to help you breathe normally again. What are the causes? There are three kinds of sleep apnea:  Obstructive sleep apnea. This is caused by a blocked or collapsed airway.  Central sleep apnea. This happens when the brain does not send the right signals to the muscles that control breathing.  Mixed sleep apnea. This is a combination of obstructive and central sleep apnea. The most common cause of this condition is a collapsed or blocked airway. This can happen if:  Your throat muscles are too relaxed.  Your tongue and tonsils are too large.  You are overweight.  Your airway is too small.   What increases the risk?  Being overweight.  Smoking.  Having a small airway.  Being older.  Being  male.  Drinking alcohol.  Taking medicines to calm yourself (sedatives or tranquilizers).  Having family members with the condition. What are the signs or symptoms?  Trouble staying asleep.  Being sleepy or tired during the day.  Getting angry a lot.  Loud snoring.  Headaches in the morning.  Not being able to focus your mind (concentrate).  Forgetting things.  Less interest in sex.  Mood swings.  Personality changes.  Feelings of sadness (depression).  Waking up a lot during the night to pee (urinate).  Dry mouth.  Sore throat. How is this diagnosed?  Your medical history.  A physical exam.  A test that is done when you are sleeping (sleep study). The test is most often done in a sleep lab but may also be done at home. How is this treated?  Sleeping on your side.  Using a medicine to get rid of mucus in your nose (decongestant).  Avoiding the use of alcohol, medicines to help you relax, or certain pain medicines (narcotics).  Losing weight, if needed.  Changing your diet.  Not smoking.  Using a machine to open your airway while you sleep, such as: ? An oral appliance. This is a mouthpiece that shifts your lower jaw forward. ? A CPAP device. This device blows air through a mask when you breathe out (exhale). ? An EPAP device. This has valves that you put in each nostril. ? A BPAP device. This device blows air through a mask when you breathe in (inhale) and breathe out.  Having surgery if other treatments do not work. It   is important to get treatment for sleep apnea. Without treatment, it can lead to:  High blood pressure.  Coronary artery disease.  In men, not being able to have an erection (impotence).  Reduced thinking ability.   Follow these instructions at home: Lifestyle  Make changes that your doctor recommends.  Eat a healthy diet.  Lose weight if needed.  Avoid alcohol, medicines to help you relax, and some pain  medicines.  Do not use any products that contain nicotine or tobacco, such as cigarettes, e-cigarettes, and chewing tobacco. If you need help quitting, ask your doctor. General instructions  Take over-the-counter and prescription medicines only as told by your doctor.  If you were given a machine to use while you sleep, use it only as told by your doctor.  If you are having surgery, make sure to tell your doctor you have sleep apnea. You may need to bring your device with you.  Keep all follow-up visits as told by your doctor. This is important. Contact a doctor if:  The machine that you were given to use during sleep bothers you or does not seem to be working.  You do not get better.  You get worse. Get help right away if:  Your chest hurts.  You have trouble breathing in enough air.  You have an uncomfortable feeling in your back, arms, or stomach.  You have trouble talking.  One side of your body feels weak.  A part of your face is hanging down. These symptoms may be an emergency. Do not wait to see if the symptoms will go away. Get medical help right away. Call your local emergency services (911 in the [place] Do not drive yourself to the hospital. Summary  This condition affects breathing during sleep.  The most common cause is a collapsed or blocked airway.  The goal of treatment is to help you breathe normally while you sleep. This information is not intended to replace advice given to you by your health care provider. Make sure you discuss any questions you have with your health care provider. Document Revised: 04/23/2018 Document Reviewed: 03/02/2018 [employer] Patient Education  2021 [employer]  

## 2020-10-01 NOTE — Progress Notes (Signed)
Cm sent to [facility]

## 2021-09-25 ENCOUNTER — Ambulatory Visit: Payer: Medicare HMO | Admitting: Family Medicine
